# Patient Record
Sex: Female | Born: 1940 | Race: White | Hispanic: No | Marital: Married
Health system: Southern US, Community
[De-identification: ages and names within clinical notes are randomized; demographics above are authoritative.]

---

## 2011-06-24 ENCOUNTER — Other Ambulatory Visit: Payer: Self-pay | Admitting: Rehabilitation

## 2011-06-24 DIAGNOSIS — M545 Low back pain: Secondary | ICD-10-CM

## 2011-06-28 ENCOUNTER — Ambulatory Visit
Admission: RE | Admit: 2011-06-28 | Discharge: 2011-06-28 | Disposition: A | Discharge status: Home / Self Care | Payer: Medicare Other | Source: Ambulatory Visit | Attending: Rehabilitation | Admitting: Rehabilitation

## 2011-06-28 DIAGNOSIS — M545 Low back pain: Secondary | ICD-10-CM

## 2011-06-28 MED ORDER — GADOBENATE DIMEGLUMINE 529 MG/ML IV SOLN
8.0000 mL | Freq: Once | INTRAVENOUS | Status: AC | PRN
  Administered 2011-06-28: 8 mL via INTRAVENOUS

## 2012-11-25 DIAGNOSIS — M6790 Unspecified disorder of synovium and tendon, unspecified site: Secondary | ICD-10-CM | POA: Insufficient documentation

## 2013-07-28 ENCOUNTER — Other Ambulatory Visit: Payer: Self-pay | Admitting: Orthopaedic Surgery

## 2013-07-28 DIAGNOSIS — M545 Low back pain: Secondary | ICD-10-CM

## 2013-08-09 ENCOUNTER — Ambulatory Visit
Admission: RE | Admit: 2013-08-09 | Discharge: 2013-08-09 | Disposition: A | Discharge status: Home / Self Care | Payer: Medicare Other | Source: Ambulatory Visit | Attending: Orthopaedic Surgery | Admitting: Orthopaedic Surgery

## 2013-08-09 DIAGNOSIS — M545 Low back pain: Secondary | ICD-10-CM

## 2013-08-09 MED ORDER — GADOBENATE DIMEGLUMINE 529 MG/ML IV SOLN
8.0000 mL | Freq: Once | INTRAVENOUS | Status: AC | PRN
  Administered 2013-08-09: 8 mL via INTRAVENOUS

## 2013-08-13 ENCOUNTER — Other Ambulatory Visit: Payer: Self-pay | Admitting: Orthopaedic Surgery

## 2013-08-13 DIAGNOSIS — M5410 Radiculopathy, site unspecified: Secondary | ICD-10-CM

## 2013-08-24 ENCOUNTER — Ambulatory Visit
Admission: RE | Admit: 2013-08-24 | Discharge: 2013-08-24 | Disposition: A | Discharge status: Home / Self Care | Payer: No Typology Code available for payment source | Source: Ambulatory Visit | Attending: Orthopaedic Surgery | Admitting: Orthopaedic Surgery

## 2013-08-24 DIAGNOSIS — M5410 Radiculopathy, site unspecified: Secondary | ICD-10-CM

## 2013-12-20 ENCOUNTER — Other Ambulatory Visit: Payer: Self-pay | Admitting: Orthopaedic Surgery

## 2013-12-20 DIAGNOSIS — M961 Postlaminectomy syndrome, not elsewhere classified: Secondary | ICD-10-CM

## 2013-12-24 ENCOUNTER — Ambulatory Visit
Admission: RE | Admit: 2013-12-24 | Discharge: 2013-12-24 | Disposition: A | Discharge status: Home / Self Care | Payer: No Typology Code available for payment source | Source: Ambulatory Visit | Attending: Orthopaedic Surgery | Admitting: Orthopaedic Surgery

## 2013-12-24 DIAGNOSIS — M961 Postlaminectomy syndrome, not elsewhere classified: Secondary | ICD-10-CM

## 2014-03-21 DIAGNOSIS — Z9889 Other specified postprocedural states: Secondary | ICD-10-CM | POA: Insufficient documentation

## 2014-03-21 DIAGNOSIS — M545 Low back pain, unspecified: Secondary | ICD-10-CM | POA: Insufficient documentation

## 2014-04-20 DIAGNOSIS — M40209 Unspecified kyphosis, site unspecified: Secondary | ICD-10-CM | POA: Insufficient documentation

## 2014-05-27 DIAGNOSIS — I5030 Unspecified diastolic (congestive) heart failure: Secondary | ICD-10-CM | POA: Insufficient documentation

## 2014-05-27 DIAGNOSIS — R0689 Other abnormalities of breathing: Secondary | ICD-10-CM | POA: Insufficient documentation

## 2014-05-27 DIAGNOSIS — I10 Essential (primary) hypertension: Secondary | ICD-10-CM | POA: Insufficient documentation

## 2014-05-27 DIAGNOSIS — M064 Inflammatory polyarthropathy: Secondary | ICD-10-CM | POA: Insufficient documentation

## 2014-05-27 DIAGNOSIS — M2140 Flat foot [pes planus] (acquired), unspecified foot: Secondary | ICD-10-CM | POA: Insufficient documentation

## 2014-05-27 DIAGNOSIS — D7589 Other specified diseases of blood and blood-forming organs: Secondary | ICD-10-CM | POA: Insufficient documentation

## 2014-05-27 DIAGNOSIS — M79643 Pain in unspecified hand: Secondary | ICD-10-CM | POA: Insufficient documentation

## 2014-05-27 DIAGNOSIS — M1990 Unspecified osteoarthritis, unspecified site: Secondary | ICD-10-CM | POA: Insufficient documentation

## 2014-05-27 DIAGNOSIS — E669 Obesity, unspecified: Secondary | ICD-10-CM | POA: Insufficient documentation

## 2014-05-27 DIAGNOSIS — R0989 Other specified symptoms and signs involving the circulatory and respiratory systems: Secondary | ICD-10-CM | POA: Insufficient documentation

## 2014-05-27 DIAGNOSIS — L03119 Cellulitis of unspecified part of limb: Secondary | ICD-10-CM | POA: Insufficient documentation

## 2014-05-27 DIAGNOSIS — N3946 Mixed incontinence: Secondary | ICD-10-CM | POA: Insufficient documentation

## 2014-05-27 DIAGNOSIS — B351 Tinea unguium: Secondary | ICD-10-CM | POA: Insufficient documentation

## 2014-05-27 DIAGNOSIS — R0609 Other forms of dyspnea: Secondary | ICD-10-CM

## 2014-05-27 DIAGNOSIS — I059 Rheumatic mitral valve disease, unspecified: Secondary | ICD-10-CM | POA: Insufficient documentation

## 2014-05-27 DIAGNOSIS — R05 Cough: Secondary | ICD-10-CM | POA: Insufficient documentation

## 2014-05-27 DIAGNOSIS — M659 Synovitis and tenosynovitis, unspecified: Secondary | ICD-10-CM | POA: Insufficient documentation

## 2014-05-27 DIAGNOSIS — M25569 Pain in unspecified knee: Secondary | ICD-10-CM | POA: Insufficient documentation

## 2014-05-27 DIAGNOSIS — M353 Polymyalgia rheumatica: Secondary | ICD-10-CM | POA: Insufficient documentation

## 2014-05-27 DIAGNOSIS — R053 Chronic cough: Secondary | ICD-10-CM | POA: Insufficient documentation

## 2014-05-27 DIAGNOSIS — J209 Acute bronchitis, unspecified: Secondary | ICD-10-CM | POA: Insufficient documentation

## 2014-05-27 DIAGNOSIS — N952 Postmenopausal atrophic vaginitis: Secondary | ICD-10-CM | POA: Insufficient documentation

## 2014-05-27 DIAGNOSIS — E785 Hyperlipidemia, unspecified: Secondary | ICD-10-CM | POA: Insufficient documentation

## 2014-05-27 DIAGNOSIS — I872 Venous insufficiency (chronic) (peripheral): Secondary | ICD-10-CM | POA: Insufficient documentation

## 2014-05-27 DIAGNOSIS — M4800 Spinal stenosis, site unspecified: Secondary | ICD-10-CM | POA: Insufficient documentation

## 2014-05-27 DIAGNOSIS — M7989 Other specified soft tissue disorders: Secondary | ICD-10-CM | POA: Insufficient documentation

## 2014-05-27 DIAGNOSIS — G2581 Restless legs syndrome: Secondary | ICD-10-CM | POA: Insufficient documentation

## 2014-05-27 DIAGNOSIS — I341 Nonrheumatic mitral (valve) prolapse: Secondary | ICD-10-CM | POA: Insufficient documentation

## 2014-05-27 DIAGNOSIS — M76829 Posterior tibial tendinitis, unspecified leg: Secondary | ICD-10-CM | POA: Insufficient documentation

## 2014-05-27 DIAGNOSIS — K219 Gastro-esophageal reflux disease without esophagitis: Secondary | ICD-10-CM | POA: Insufficient documentation

## 2014-05-27 DIAGNOSIS — M7750 Other enthesopathy of unspecified foot: Secondary | ICD-10-CM | POA: Insufficient documentation

## 2015-01-17 DIAGNOSIS — M25559 Pain in unspecified hip: Secondary | ICD-10-CM | POA: Insufficient documentation

## 2015-05-18 ENCOUNTER — Other Ambulatory Visit: Payer: Self-pay | Admitting: Orthopaedic Surgery

## 2015-05-18 DIAGNOSIS — M961 Postlaminectomy syndrome, not elsewhere classified: Secondary | ICD-10-CM

## 2015-05-25 ENCOUNTER — Ambulatory Visit
Admission: RE | Admit: 2015-05-25 | Discharge: 2015-05-25 | Disposition: A | Discharge status: Home / Self Care | Payer: PRIVATE HEALTH INSURANCE | Source: Ambulatory Visit | Attending: Orthopaedic Surgery | Admitting: Orthopaedic Surgery

## 2015-05-25 DIAGNOSIS — M961 Postlaminectomy syndrome, not elsewhere classified: Secondary | ICD-10-CM

## 2015-05-25 DIAGNOSIS — I341 Nonrheumatic mitral (valve) prolapse: Secondary | ICD-10-CM | POA: Insufficient documentation

## 2015-05-25 MED ORDER — ONDANSETRON 8 MG PO TBDP
8.0000 mg | ORAL_TABLET | Freq: Once | ORAL | Status: AC
  Administered 2015-05-25: 8 mg via ORAL

## 2015-05-25 MED ORDER — IOHEXOL 300 MG/ML  SOLN
10.0000 mL | Freq: Once | INTRAMUSCULAR | Status: DC | PRN
  Administered 2015-05-25: 10 mL via INTRATHECAL

## 2015-05-25 NOTE — Discharge Instructions (Signed)
Myelogram Discharge Instructions  1. Go home and rest quietly for the next 24 hours.  It is important to lie flat for the next 24 hours.  Get up only to go to the restroom.  You may lie in the bed or on a couch on your back, your stomach, your left side or your right side.  You may have one pillow under your head.  You may have pillows between your knees while you are on your side or under your knees while you are on your back.  2. DO NOT drive today.  Recline the seat as far back as it will go, while still wearing your seat belt, on the way home.  3. You may get up to go to the bathroom as needed.  You may sit up for 10 minutes to eat.  You may resume your normal diet and medications unless otherwise indicated.  Drink lots of extra fluids today and tomorrow.  4. The incidence of headache, nausea, or vomiting is about 5% (one in 20 patients).  If you develop a headache, lie flat and drink plenty of fluids until the headache goes away.  Caffeinated beverages may be helpful.  If you develop severe nausea and vomiting or a headache that does not go away with flat bed rest, call [phone].  5. You may resume normal activities after your 24 hours of bed rest is over; however, do not exert yourself strongly or do any heavy lifting tomorrow. If when you get up you have a headache when standing, go back to bed and force fluids for another 24 hours.  6. Call your physician for a follow-up appointment.  The results of your myelogram will be sent directly to your physician by the following day.  7. If you have any questions or if complications develop after you arrive home, please call [phone].  Discharge instructions have been explained to the patient.  The patient, or the person responsible for the patient, fully understands these instructions.      May resume Venalefaxine on Sept. 2, 2016, after 9:30 am.

## 2015-05-25 NOTE — Progress Notes (Addendum)
Pt states she has been off Venalafaxine for the past several days.  Discharge instructions explained to pt.

## 2015-07-25 ENCOUNTER — Other Ambulatory Visit: Payer: Self-pay | Admitting: Surgical Oncology

## 2015-07-25 DIAGNOSIS — R921 Mammographic calcification found on diagnostic imaging of breast: Secondary | ICD-10-CM

## 2015-07-31 ENCOUNTER — Other Ambulatory Visit: Payer: Self-pay | Admitting: Surgical Oncology

## 2015-07-31 DIAGNOSIS — R921 Mammographic calcification found on diagnostic imaging of breast: Secondary | ICD-10-CM

## 2015-08-01 ENCOUNTER — Ambulatory Visit
Admission: RE | Admit: 2015-08-01 | Discharge: 2015-08-01 | Disposition: A | Discharge status: Home / Self Care | Payer: Medicare Other | Source: Ambulatory Visit | Attending: Surgical Oncology | Admitting: Surgical Oncology

## 2015-08-01 DIAGNOSIS — R921 Mammographic calcification found on diagnostic imaging of breast: Secondary | ICD-10-CM

## 2017-02-19 IMAGING — CR DG MYELOGRAPHY LUMBAR INJ MULTI REGION
3 series · 3 of 3 positions shown · non-contrast
Comparison: none

CLINICAL DATA: Weakness. The right leg gives way. Thoracolumbar
fusion.
TECHNIQUE: Contiguous axial images were obtained through the Thoracic and
Lumbar spine after the intrathecal infusion of infusion. Coronal and
sagittal reconstructions were obtained of the axial image sets.

[w lumbar spine lat]
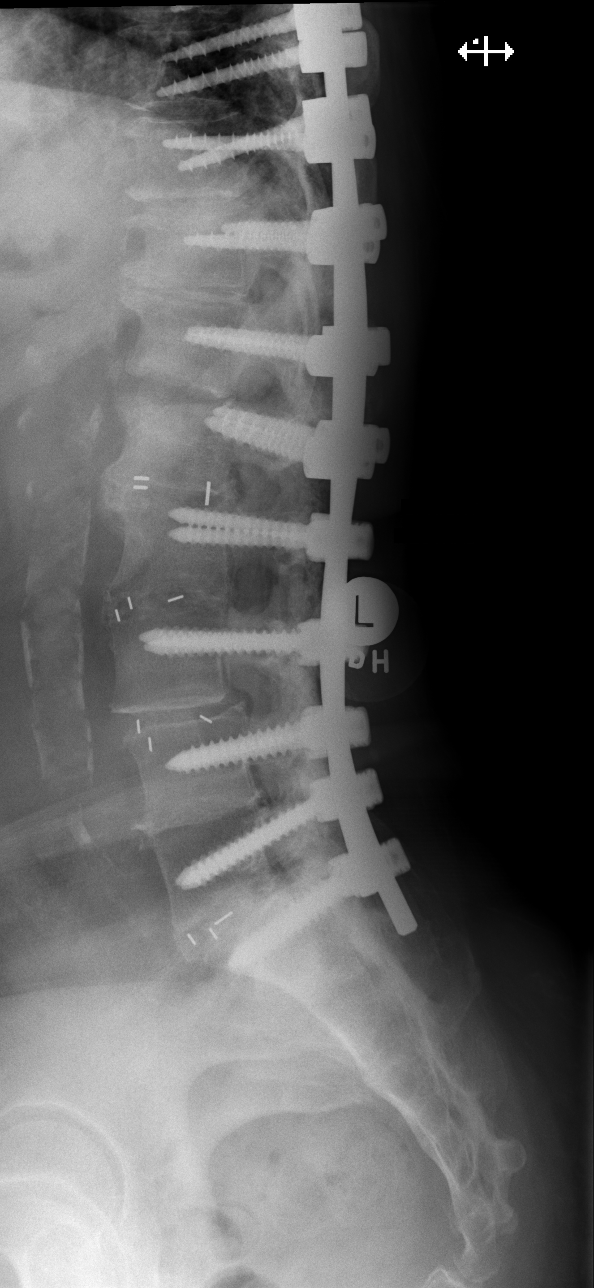

[w lumbar spine flexion]
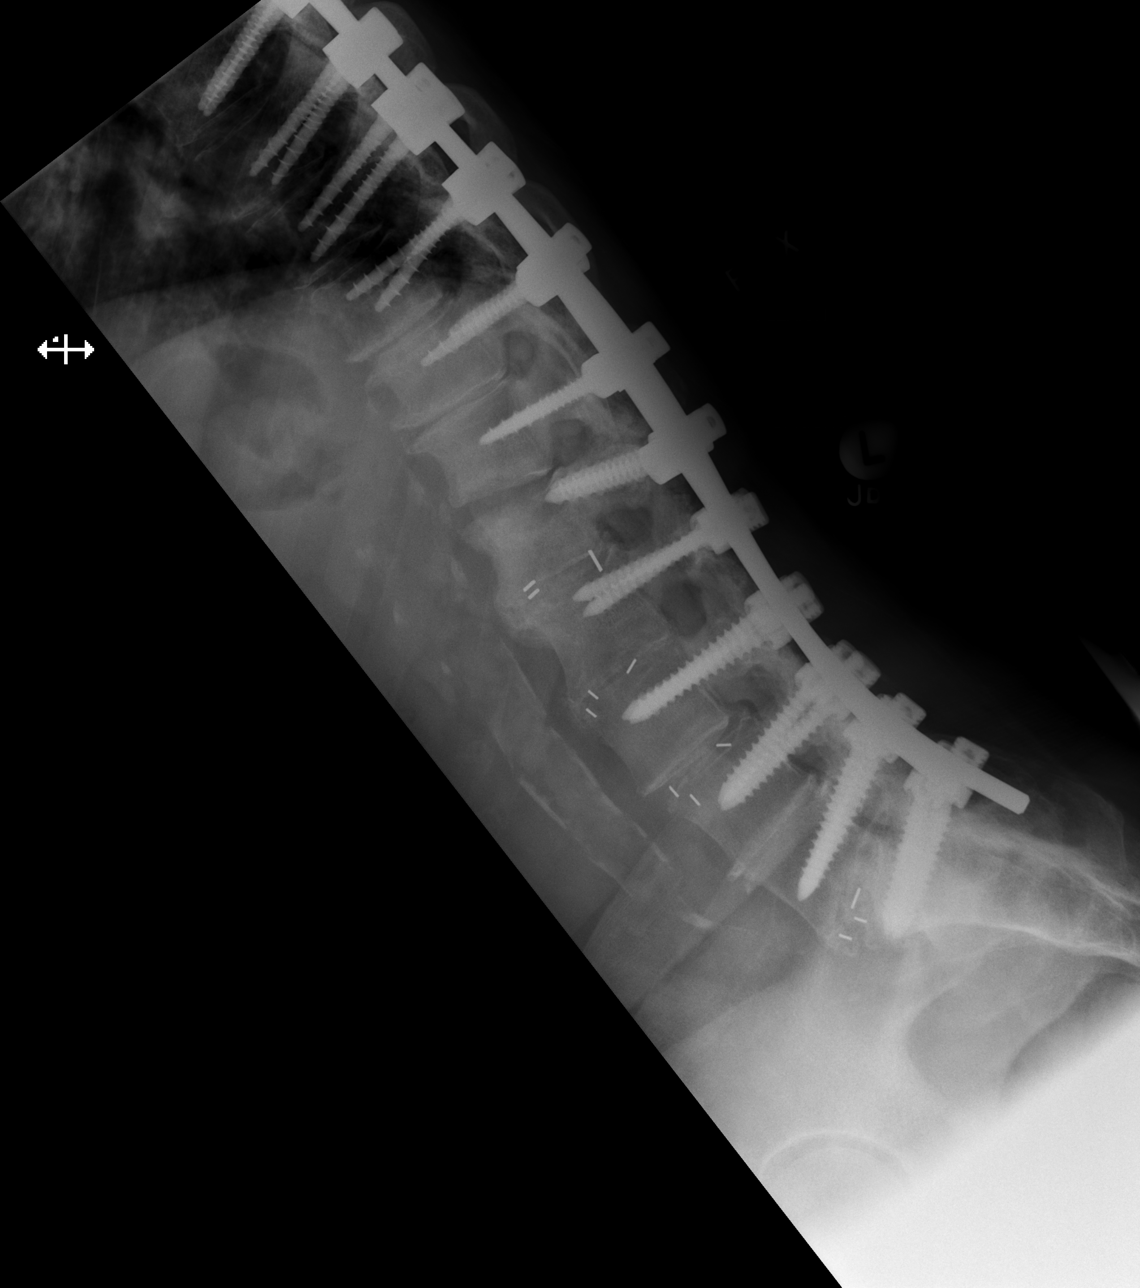

[w lumbar spine extension]
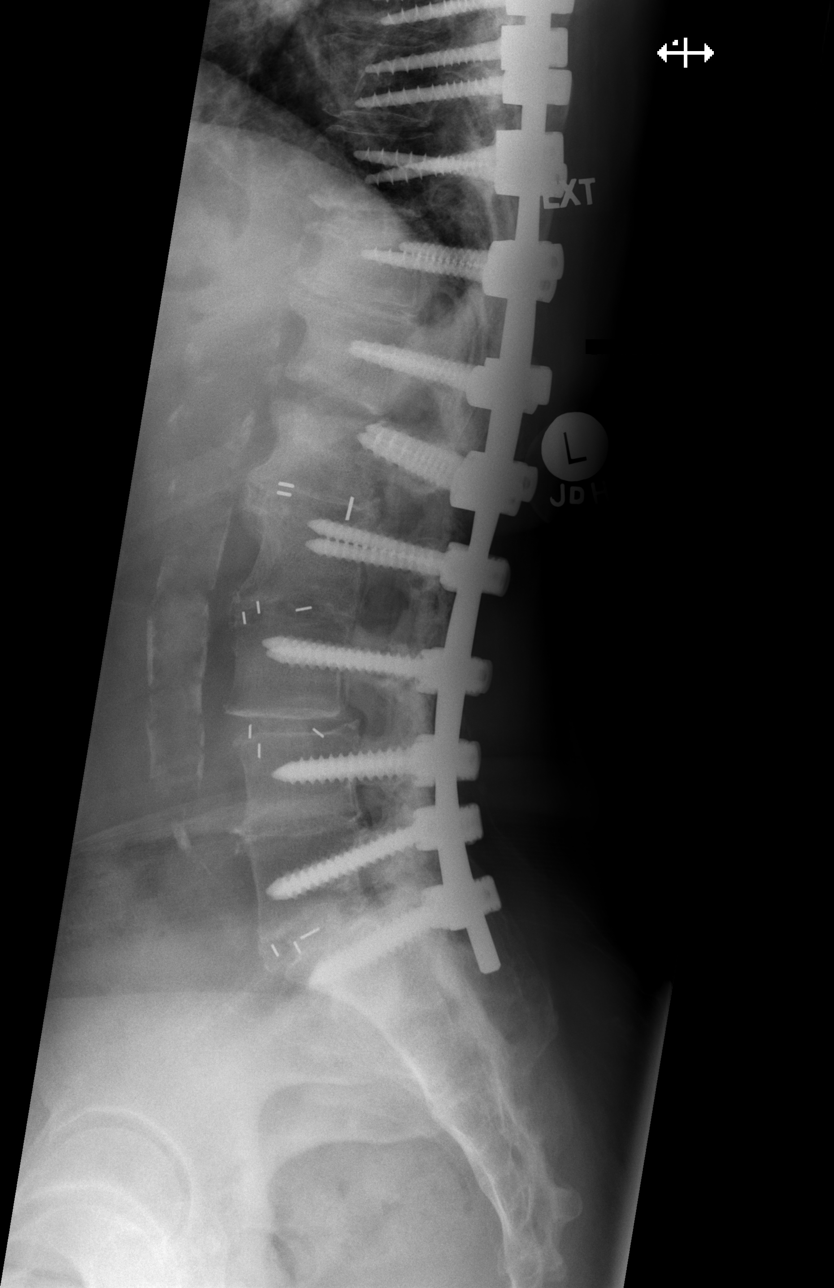

[3 of 3 positions shown; findings below may reference images not displayed]

FLUOROSCOPY TIME:  Fluoroscopy Time:  1 minutes 19 seconds

Number of Acquired Images:  24

PROCEDURE:
LUMBAR PUNCTURE FOR THORACIC AND LUMBAR MYELOGRAM

After thorough discussion of risks and benefits of the procedure
including bleeding, infection, injury to nerves, blood vessels,
adjacent structures as well as headache and CSF leak, written and
oral informed consent was obtained. Consent was obtained by Dr.
Rita De Kassia Abade.

Patient was positioned prone on the fluoroscopy table. Local
anesthesia was provided with 1% lidocaine without epinephrine after
prepped and draped in the usual sterile fashion. Puncture was
performed at L5 using a 3 1/2 inch 22-gauge spinal needle via
midline approach. Using a single pass through the dura, the needle
was placed within the thecal sac, with return of clear CSF. 10 mL of
Cmnipaque-FVV was injected into the thecal sac, with normal
opacification of the nerve roots and cauda equina consistent with
free flow within the subarachnoid space. The patient was then moved
to the trendelenburg position and contrast flowed into the Thoracic
spine region.

I personally performed the lumbar puncture and administered the
intrathecal contrast. I also personally supervised acquisition of
the myelogram images.
FINDINGS: THORACIC AND LUMBAR MYELOGRAM FINDINGS:

Pedicle screw and rod fixation is evident from T4 through S1. The
wide laminectomy is evident throughout the lumbar spine

There is slight subarachnoid impingement on the left at L4-5. Grade
1 anterolisthesis is noted at L3-4 with uncovering of residual disc
material effacing the ventral CSF. The

Slight anterolisthesis is also present at T12 L1.

Chronic endplate changes are evident at T10-11 and T11-12. The right
pedicle screw at T4 is above the superior endplate. There is focal
kyphosis at T3-4.

Upright, flexion, and extension views of the lumbar spine
demonstrate no pathologic movement. Atherosclerotic changes are
noted in the aorta.

CT THORACIC MYELOGRAM FINDINGS:

Pedicle screw and rod fixation is present from T4 through S1. As
noted above, the right T4 pedicle screw extends into the T3-4 disc
space. A superior endplate fracture is present at T4 with
exaggerated kyphosis. There is significant lucency surrounding the
anterior aspect of the T4 screw on the right suggesting motion.

The hardware otherwise appears to be intact through T12. Vertebral
body heights and alignment are otherwise maintained.

C7-T1: A rightward disc osteophyte complex effaces the ventral CSF
and potentially contacts the cord. Mild right foraminal narrowing is
present.

C7-T1: Moderate facet hypertrophy is present without significant
disc protrusion or stenosis.

T1-2:  Negative.

T2-3:  Negative.

T3-4: There is uncovering of a broad-based disc protrusion which
partially effaces the ventral CSF. The foramina are patent.

T4-5:  Negative.

T5-6: A shallow broad-based disc protrusion is present without
significant stenosis.

T6-7: A rightward disc protrusion is present. There is no
significant stenosis.

T7-8: A shallow central disc protrusion partially effaces the
ventral CSF. The foramina are patent.

T8-9: A shallow central disc protrusion is present without
significant stenosis.

T9-10: Moderate facet hypertrophy is present. Spurring leads to mild
foraminal narrowing, worse on the right.

T10-11: A mild broad-based disc protrusion is present. Moderate
facet hypertrophy is present bilaterally. There is no significant
stenosis.

T11-12: Mild facet hypertrophy is noted bilaterally. A mild
broad-based disc bulge is present without significant stenosis. A
perineural root sleeve cyst is noted on the left.

T12-L1: No significant disc protrusion or stenosis is present. A
perineural root sleeve cyst is present on the left.

CT LUMBAR MYELOGRAM FINDINGS:

The lumbar spine is imaged from the midbody of T11 through S3-4.
Pedicle screw and rod fixation extends from the thoracic spine to
the S1 level.

The lumbar spine is fused anteriorly and posteriorly at L1-S1.
Lucency and endplate erosions or fractures are present at T12-L1.
There is no fusion at this level. A mild disc protrusion is present
without significant stenosis.

L1-2: Patient is fused. A wide laminectomy is noted. There is some
bone growth into the foramen on the right a posterior foraminotomy
is noted. The left foramen is patent.

L2-3: Anterior fusion is present. Wide laminectomy is present. No
residual or recurrent stenosis is present.

L3-4: Despite the slight anterolisthesis and uncovering of residual
disc material there is no significant stenosis following laminectomy
and bilateral foraminotomies.

L4-5: No significant residual or recurrent stenosis following wide
laminectomy.

L5-S1: The central canal is decompressed. A left foraminotomy is
present. No residual or recurrent stenosis is present.
IMPRESSION: 1. Pedicle screw and rod fixation T4-S1.
2. Lucency surrounding the right pedicle screw at T4. The right
pedicle screw extends into the disc space.
3. Superior endplate compression fracture at T4 with exaggerated
kyphosis at T3-4. The lucency about the T4 screw suggests some
degree of motion.
4. Mild central disc protrusions in the thoracic spine without focal
stenosis.
5. No osseous fusion is evident at T12-L1.
6. Solid anterior and posterior fusion L1-S1.
7. Bone growth into the right foramen with moderate right foraminal
stenosis at L1-2 despite a posterior foraminotomy.
8. No other residual or recurrent stenosis in the lumbar spine.

## 2021-02-21 DEATH — deceased
